# Patient Record
Sex: Female | Born: 2005 | Race: White | Hispanic: Yes | Marital: Single | Smoking: Never smoker
Health system: Southern US, Community
[De-identification: ages and names within clinical notes are randomized; demographics above are authoritative.]

## PROBLEM LIST (undated history)

## (undated) DIAGNOSIS — L309 Dermatitis, unspecified: Secondary | ICD-10-CM

## (undated) DIAGNOSIS — K5900 Constipation, unspecified: Secondary | ICD-10-CM

## (undated) HISTORY — DX: Constipation, unspecified: K59.00

## (undated) HISTORY — DX: Dermatitis, unspecified: L30.9

---
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
## 2024-01-12 ENCOUNTER — Ambulatory Visit (INDEPENDENT_AMBULATORY_CARE_PROVIDER_SITE_OTHER)

## 2024-01-12 VITALS — Ht 63.11 in | Wt 123.8 lb

## 2024-01-12 DIAGNOSIS — G8929 Other chronic pain: Secondary | ICD-10-CM | POA: Diagnosis not present

## 2024-01-12 DIAGNOSIS — M545 Low back pain, unspecified: Secondary | ICD-10-CM | POA: Diagnosis not present

## 2024-01-12 NOTE — Progress Notes (Signed)
 Pediatric Acute Care Visit  PCP: [name], MD   Chief Complaint  Patient presents with   Back Pain    Pt has began to have back pain since accident some time last year, pain makes pt limp and difficult to stand for long periods of time     Subjective:  HPI:  Julia Peters is a 19 y.o. female presenting for back pain.  Initially presented on 06/03/22 for 5-days of back pain following an incident while deadlifting. Has tried PT, however had multiple no-shows, so she was dismissed from PT. She has been doing the exercises and stretches she learned from PT, however in the last month the pain has gotten worse. Prior, the pain was on the left side, extending down her back to her L hip, and she would feel weakness of the whole leg. Now the pain also affects the right side--to the point where she has to limp. Had to miss work a couple of days ago because of the pain. She has some left over pain medications and muscle relaxants which she takes occasionally. She has tried ice and OTC pain meds which aren't helping. Would like an MRI.  Review of Systems  Constitutional:  Positive for activity change.  Musculoskeletal:  Positive for back pain.    Meds: Current Outpatient Medications  Medication Sig Dispense Refill   HYDROcodone -acetaminophen  (NORCO/VICODIN) 5-325 MG tablet Take 1 tablet by mouth every 6 (six) hours as needed for moderate pain (pain score 4-6). 15 tablet 0   ibuprofen  (ADVIL ) 800 MG tablet Take 1 tablet (800 mg total) by mouth every 8 (eight) hours as needed. 30 tablet 0   No current facility-administered medications for this visit.    ALLERGIES: Allergies[1]  Past medical, surgical, social, family history reviewed as well as allergies and medications and updated as needed.  Objective:   Physical Examination:  Temp:   Pulse:   BP:   (Blood pressure %iles are not available for patients who are 18 years or older.)  Wt: 123 lb 12.8 oz (56.2 kg)  Ht: 5' 3.11  (1.603 m)  BMI: Body mass index is 21.85 kg/m. (67 %ile (Z= 0.43) based on CDC (Girls, 2-20 Years) BMI-for-age based on BMI available on 02/11/2023 from contact on 02/11/2023.)  General: Alert, well-appearing in NAD Cardiovascular: Regular rate and rhythm, S1 and S2 normal. No murmur, rub, or gallop appreciated Pulmonary: Normal work of breathing. Clear to auscultation bilaterally with no wheezes or crackles present MSK: + straight leg test L-side, Full ROM of b/l hips, Lower extremity strength 5/5 b/l, no pinpoint tenderness of spine, point tenderness of L pelvic bone   Assessment/Plan:   Julia Peters is a 19 y.o. old female here for acute exacerbation of low back pain.  1. Chronic left-sided low back pain without sciatica (Primary) - pain is chronic with an acute exacerbation--suspect she will benefit from continued stretching and exercises, will refer to sports medicine for further recommnedations on exercises and guidance on imaging - Ambulatory referral to Sports Medicine   Decisions were made and discussed with caregiver who was in agreement.  Follow up: No follow-ups on file.   Flint Sola, MD Pediatrics PGY-1 [facility]     [1] No Known Allergies

## 2024-01-14 ENCOUNTER — Ambulatory Visit: Payer: Self-pay

## 2024-01-14 ENCOUNTER — Encounter: Payer: Self-pay | Admitting: Family Medicine

## 2024-01-14 ENCOUNTER — Ambulatory Visit: Payer: Self-pay | Admitting: Family Medicine

## 2024-01-14 VITALS — BP 105/70 | Ht 63.0 in | Wt 123.0 lb

## 2024-01-14 DIAGNOSIS — M5416 Radiculopathy, lumbar region: Secondary | ICD-10-CM

## 2024-01-14 NOTE — Progress Notes (Signed)
 "  PCP: Julia Clapper, MD  Patient is a 19 y.o. female here for left low back pain.  HPI - Pain started 06/03/2024when the she hurt herself deadlifting. Pain worsens with walking and activity, described as sore and aching at that time.  - Went to ER 06-09-22 d/t experiencing pain radiating down her left leg when deadlifting. At that time had difficulty ambulating and decreased strength on the L side. Denied any red flag symptoms at that time. Lumbar XR negative. Patient was given Toradol , symptoms consistent with sciatic back pain/lumbar radiculopathy. Prescribed Flexeril  and Naproxen .  - Went to PT after referral by her PCP a couple times and did home exercises instructed by PT, but was dismissed due to multiple no-shows - Went to see Dr. Eldonna (spine surgeon) in August 2024, but pain improved by that time therefore proceeded with conservative management and complete short course of PT. Meloxicam  and Robaxin  was prescribed at this time as well.  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 Has had intermittent pain since August to now.  - Triggered primarily by walking and leg exercises, describes feeling a popping sensation in the base of her sacrum on the L, otherwise the pain is aching, painful to touch, radiating to below her L glute. - Has tried hot/cold therapy, Icy-Hot, Tylenol , and Ibuprofen  - She would like to pursue getting an MRI at this time since the pain persists, this was also the recommendation per Dr. Eldonna  History reviewed. No pertinent past medical history.  Medications Ordered Prior to Encounter[1]  Past Surgical History:  Procedure Laterality Date   LAPAROSCOPIC APPENDECTOMY N/A 02/11/2023   Procedure: APPENDECTOMY LAPAROSCOPIC;  Surgeon: Julia Cough, MD;  Location: [facility] OR;  Service: General;  Laterality: N/A;    Allergies[2]  BP  105/70   Ht 5' 3 (1.6 m)   Wt 123 lb (55.8 kg)   BMI 21.79 kg/m       No data to display              No data to display              Objective:  Physical Exam:  Low Back/Sacrum/Hip Exam Gen: NAD, comfortable in exam room MSK: Inspection: No gross deformity of thoracic or lumbar spine.  No scoliosis. Palpation: TTP L>R SIJ.  Positive seated flexion test. TTP along left paraspinal muscles at lumbar-sacral junction ROM: Decreased forward flexion to 45 degrees with pain, decreased ROM and pain with extension, pain with R side-bending, but normal L side-bending. Pain with rotation on the left.  Special Tests: + SLR on L, negative on the R Strength: L great toe weakness 3/5, normal R great toe strength 5/5.   5/5 RLE.  Able to toe walk and heel walk. NEURO: Sensation intact to light touch.  DTR 2/4 Achilles and patella bilaterally Vascular: Pulses 2+ and symmetric lower extremity bilaterally, no edema  Assessment and Plan:   Assessment & Plan Chronic left-sided lumbar radiculopathy Chronic low back pain since 06-03-24after dead-lifting with recent acute exacerbation and worsening. Has tried conservative measures with home therapies, medications (Flexeril , Naproxen , Robaxin , Meloxicam , and Toradol ), PT, and home exercises for over 6+ weeks without improvement.  Pain seems to be triggered by her daily activities, walking, and work.  Since patient has been evaluated by PMR and  conservative measures have been attempted, without much relief, and with left lower extremity weakness, along with radicular pain there is concern for lumbar radiculopathy vs disc herniation/pinched nerve.  Will pursue lumbar MRI to determine next steps. - MRI Lumbar spine ordered to r/o herniated disc or pinched nerve.  She would be interested in injections or surgery if indicated. - Follow up 1 week s/p MRI to review results and next steps - Discussed continuing conservative home exercises and OTC  medications as needed at this time - Briefly reviewed potential treatment options s/p MRI results including conservative management (medications, PT), injections, and potential surgery if indicated. All questions were answered and patient understood the plan.   Julia Melena, DO [facility]     [1]  Current Outpatient Medications on File Prior to Visit  Medication Sig Dispense Refill   HYDROcodone -acetaminophen  (NORCO/VICODIN) 5-325 MG tablet Take 1 tablet by mouth every 6 (six) hours as needed for moderate pain (pain score 4-6). 15 tablet 0   ibuprofen  (ADVIL ) 800 MG tablet Take 1 tablet (800 mg total) by mouth every 8 (eight) hours as needed. 30 tablet 0   No current facility-administered medications on file prior to visit.  [2] No Known Allergies  "

## 2024-01-14 NOTE — Progress Notes (Unsigned)
 [facility] Department of [facility]   PCP: [name], MD  CHIEF COMPLAINT: ***  HPI: Patient is a pleasant 19 y.o. female who presents today for chronic low back pain.  Patient states initial injury occurred in May 2024 following an incident while dead lifting heavy object at home.  Initially she was having back pain that radiates into her left lower leg with no associated weakness or any other red flag symptoms.  Was given Flexeril  and naproxen  on ED discharge.  Followed up with her pediatrician and was referred to physical therapy.  Did pelvic she subsequently developed some left leg weakness as well.  Mentioned ordering an MRI and plan but MRI seems to have never gotten completed per chart review.  Eventually discharged from PT from multiple no-shows.  Ended up seeing Dr. Eldonna a PM&R doc with orthopedic on 08/14/2022.  He agreed with acute left-sided lumbar back pain with radiation to left posterior lateral thigh.  She was pain-free at that time per his note.  Recent x-rays looked normal.  He did prescribe meloxicam  and Robaxin  at that time in case pain returned.  She apparently had multiple months of resolution of pain prior to returning again in the last couple weeks/months.  Recently saw her pediatrician again on 01/12/2024 for similar back pain and was referred to us  for further eval and management.  Today at the appointment patient states***   PMH: No past medical history on file.  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  Meds:  Previous Medications   HYDROCODONE -ACETAMINOPHEN  (NORCO/VICODIN) 5-325 MG TABLET    Take 1 tablet by mouth every 6 (six) hours as needed for moderate pain (pain score 4-6).   IBUPROFEN  (ADVIL ) 800 MG TABLET    Take 1 tablet (800 mg total) by mouth every 8 (eight) hours as needed.    Social:  Social History   Tobacco Use   Smoking status: Never   Smokeless tobacco: Never  Substance Use Topics   Alcohol use: Never    REVIEW OF SYSTEMS:  ROS negative except as noted in HPI above   Objective Exam:  There were no vitals filed for this visit.  GENERAL: Patient is afebrile, Vital signs reviewed, well appearing, Patient appears comfortable, Alert and lucid. No apparent distress.   Physical Exam   Ortho Exam:  RESULTS:  Labs: No results found for this or any previous visit (from the past 48 hours).  Imaging:  No orders to display    Assessment/Plan:  No diagnosis found. ***  New Prescriptions   No medications on file    Medications, medical history, allergies, surgical history, hospitalizations, family history, social history, ROS and vitals entered by nursing staff and reviewed by myself.  I discussed with the patient the diagnosis, treatment plan, indications for return to the emergency department, and for expected follow-up. The patient verbalized an understanding. The  patient is asked if there are any questions or concerns. We discuss the case, until all issues are addressed to the patient's satisfaction.  Follow up per instructions including returning for additional office visit if symptoms worsen or proceeding to the emergency department or urgent care in the next 12-24hrs if there is an acute concerning increasing symptoms, pain, fevers, or other symptoms.  Prentice Agent, DO  8:39 AM,  01/14/2024
